# Patient Record
Sex: Female | Born: 1995 | Race: Black or African American | Hispanic: No | Marital: Single | Smoking: Never smoker
Health system: Southern US, Community
[De-identification: ages and names within clinical notes are randomized; demographics above are authoritative.]

## PROBLEM LIST (undated history)

## (undated) HISTORY — PX: NO PAST SURGERIES: SHX2092

---

## 2019-01-04 ENCOUNTER — Encounter: Payer: Self-pay | Admitting: Emergency Medicine

## 2019-01-04 ENCOUNTER — Other Ambulatory Visit: Payer: Self-pay

## 2019-01-04 ENCOUNTER — Ambulatory Visit
Admission: EM | Admit: 2019-01-04 | Discharge: 2019-01-04 | Disposition: A | Discharge status: Home / Self Care | Payer: 59 | Attending: Family Medicine | Admitting: Family Medicine

## 2019-01-04 DIAGNOSIS — J029 Acute pharyngitis, unspecified: Secondary | ICD-10-CM | POA: Diagnosis not present

## 2019-01-04 DIAGNOSIS — R509 Fever, unspecified: Secondary | ICD-10-CM | POA: Diagnosis not present

## 2019-01-04 DIAGNOSIS — A084 Viral intestinal infection, unspecified: Secondary | ICD-10-CM | POA: Diagnosis not present

## 2019-01-04 LAB — RAPID STREP SCREEN (MED CTR MEBANE ONLY): Streptococcus, Group A Screen (Direct): NEGATIVE

## 2019-01-04 NOTE — ED Provider Notes (Signed)
[facility]    CSN: [id number] Arrival date & time: 01/04/19  1128     History   Chief Complaint Chief Complaint  Patient presents with  . Headache  . Sore Throat  . Fever    HPI Jill Paul is a 23 y.o. female.   23 yo female with a c/o watery diarrhea last week which improved over the last 2 days. Denies any vomiting. States today woke up with sore throat and had a fever of 101 when she was screened at work. States mild pain with swallowing. Denies any cough or shortness of breath.      History reviewed. No pertinent past medical history.  There are no active problems to display for this patient.   History reviewed. No pertinent surgical history.  OB History   No obstetric history on file.      Home Medications    Prior to Admission medications   Medication Sig Start Date End Date Taking? Authorizing Provider  etonogestrel (NEXPLANON) 68 MG IMPL implant 1 each by Subdermal route once.   Yes [provider]    Family History Family History  Problem Relation Age of Onset  . Hypertension Mother   . Healthy Father     Social History Social History   Tobacco Use  . Smoking status: Former Games developermoker  . Smokeless tobacco: Never Used  Substance Use Topics  . Alcohol use: Yes  . Drug use: Yes    Types: Marijuana     Allergies   Patient has no known allergies.   Review of Systems Review of Systems   Physical Exam Triage Vital Signs ED Triage Vitals  Enc Vitals Group     BP 01/04/19 1150 92/63     Pulse Rate 01/04/19 1150 64     Resp 01/04/19 1150 14     Temp 01/04/19 1150 98.4 F (36.9 C)     Temp Source 01/04/19 1150 Oral     SpO2 01/04/19 1150 100 %     Weight 01/04/19 1146 130 lb (59 kg)     Height 01/04/19 1146 5\' 2"  (1.575 m)     Head Circumference --      Peak Flow --      Pain Score 01/04/19 1145 6     Pain Loc --      Pain Edu? --      Excl. in GC? --    No data found.  Updated Vital Signs BP 92/63 (BP  Location: Left Arm)   Pulse 64   Temp 98.4 F (36.9 C) (Oral)   Resp 14   Ht 5\' 2"  (1.575 m)   Wt 59 kg   SpO2 100%   BMI 23.78 kg/m   Visual Acuity Right Eye Distance:   Left Eye Distance:   Bilateral Distance:    Right Eye Near:   Left Eye Near:    Bilateral Near:     Physical Exam Vitals signs and nursing note reviewed.  Constitutional:      General: She is not in acute distress.    Appearance: She is not toxic-appearing or diaphoretic.  HENT:     Mouth/Throat:     Pharynx: Posterior oropharyngeal erythema present. No oropharyngeal exudate.  Cardiovascular:     Rate and Rhythm: Normal rate.     Pulses: Normal pulses.  Pulmonary:     Effort: Pulmonary effort is normal. No respiratory distress.  Abdominal:     General: Bowel sounds are normal. There is no  distension.     Palpations: Abdomen is soft.  Neurological:     Mental Status: She is alert.      UC Treatments / Results  Labs (all labs ordered are listed, but only abnormal results are displayed) Labs Reviewed  RAPID STREP SCREEN ([facility] ONLY)  NOVEL CORONAVIRUS, NAA (HOSPITAL ORDER, SEND-OUT TO REF LAB)  CULTURE, GROUP A STREP [facility])    EKG   Radiology No results found.  Procedures Procedures (including critical care time)  Medications Ordered in UC Medications - No data to display  Initial Impression / Assessment and Plan / UC Course  I have reviewed the triage vital signs and the nursing notes.  Pertinent labs & imaging results that were available during my care of the patient were reviewed by me and considered in my medical decision making (see chart for details).      Final Clinical Impressions(s) / UC Diagnoses   Final diagnoses:  Viral gastroenteritis  Sore throat  Fever, unspecified     Discharge Instructions     Self quarantine per [place] DHHS guidelines Await test result Rest, fluids, over the counter tylenol/advil as needed    ED Prescriptions    None       1. Lab results and diagnosis reviewed with patient 2. Recommend supportive treatment as above 3. covid test done; advice given 4. Follow-up prn  Controlled Substance Prescriptions [place] Controlled Substance Registry consulted? Not Applicable   Norval Gable, MD 01/04/19 [phone]

## 2019-01-04 NOTE — Discharge Instructions (Signed)
Self quarantine per [facility] DHHS guidelines Await test result Rest, fluids, over the counter tylenol/advil as needed

## 2019-01-04 NOTE — ED Triage Notes (Signed)
Patient c/o diarrhea for a week.  Patient c/o HAs off and on last week. Patient c/o sore throat and fever that started this morning.

## 2019-01-05 LAB — NOVEL CORONAVIRUS, NAA (HOSP ORDER, SEND-OUT TO REF LAB; TAT 18-24 HRS): SARS-CoV-2, NAA: NOT DETECTED

## 2019-01-07 LAB — CULTURE, GROUP A STREP (THRC)

## 2019-01-22 ENCOUNTER — Other Ambulatory Visit: Payer: Self-pay

## 2019-01-22 ENCOUNTER — Ambulatory Visit: Payer: 59

## 2019-01-22 ENCOUNTER — Ambulatory Visit (INDEPENDENT_AMBULATORY_CARE_PROVIDER_SITE_OTHER): Payer: 59

## 2019-01-22 ENCOUNTER — Ambulatory Visit
Admission: EM | Admit: 2019-01-22 | Discharge: 2019-01-22 | Disposition: A | Discharge status: Home / Self Care | Payer: 59 | Attending: Family Medicine | Admitting: Family Medicine

## 2019-01-22 DIAGNOSIS — M79672 Pain in left foot: Secondary | ICD-10-CM

## 2019-01-22 DIAGNOSIS — M25572 Pain in left ankle and joints of left foot: Secondary | ICD-10-CM

## 2019-01-22 DIAGNOSIS — S9002XA Contusion of left ankle, initial encounter: Secondary | ICD-10-CM

## 2019-01-22 DIAGNOSIS — W101XXA Fall (on)(from) sidewalk curb, initial encounter: Secondary | ICD-10-CM

## 2019-01-22 MED ORDER — NAPROXEN 500 MG PO TABS: 500.0000 mg | ORAL_TABLET | Freq: Two times a day (BID) | ORAL | 0 refills | Status: DC
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
## 2019-01-22 NOTE — Discharge Instructions (Addendum)
Apply ice 20 minutes out of every 2 hours 4-5 times daily for comfort.  Wear the boot for active times and may remove for quiet times.  Elevate above the level of your heart sufficiently to control swelling.

## 2019-02-09 ENCOUNTER — Ambulatory Visit: Admission: EM | Admit: 2019-02-09 | Discharge: 2019-02-09 | Discharge status: Against Medical Advice | Payer: 59

## 2019-02-09 ENCOUNTER — Inpatient Hospital Stay: Admission: RE | Admit: 2019-02-09 | Payer: 59 | Source: Ambulatory Visit

## 2020-01-30 ENCOUNTER — Ambulatory Visit (HOSPITAL_COMMUNITY)
Admission: EM | Admit: 2020-01-30 | Discharge: 2020-01-30 | Disposition: A | Discharge status: Home / Self Care | Payer: Managed Care, Other (non HMO) | Attending: Family Medicine | Admitting: Family Medicine

## 2020-01-30 ENCOUNTER — Other Ambulatory Visit: Payer: Self-pay

## 2020-01-30 ENCOUNTER — Encounter (HOSPITAL_COMMUNITY): Payer: Self-pay

## 2020-01-30 DIAGNOSIS — R21 Rash and other nonspecific skin eruption: Secondary | ICD-10-CM | POA: Diagnosis not present

## 2020-01-30 MED ORDER — VALACYCLOVIR HCL 1 G PO TABS: 1000.0000 mg | ORAL_TABLET | Freq: Three times a day (TID) | ORAL | 0 refills | Status: DC

## 2020-01-30 NOTE — Discharge Instructions (Addendum)
Valtrex 3 times a day for a week.  You  can do ibuprofen for pain every 8 hours as needed.  Up to 600 mg. Zyrtec for itching as needed

## 2020-01-30 NOTE — ED Triage Notes (Signed)
Pt presents with rash on left thigh and right hip with itching & burning  X 2 days.

## 2020-01-31 NOTE — ED Provider Notes (Signed)
[facility]    CSN: [id number] Arrival date & time: 01/30/20  1626      History   Chief Complaint Chief Complaint  Patient presents with  . Rash    HPI Jill Paul is a 24 y.o. female.   Pt is a 24 year old female that presents today with rash to left upper thigh area and right hip area.  Describes as mild itching but mostly burning and painful over the past 2 days.  Some mild chills.   Denies any fever, joint pain. Denies any recent changes in lotions, detergents, foods or other possible irritants. No recent travel. Nobody else at home has the rash. Patient has been outside but denies any contact with plants or insects. No new foods or medications.   ROS per HPI      History reviewed. No pertinent past medical history.  There are no problems to display for this patient.   Past Surgical History:  Procedure Laterality Date  . NO PAST SURGERIES      OB History   No obstetric history on file.      Home Medications    Prior to Admission medications   Medication Sig Start Date End Date Taking? Authorizing Provider  etonogestrel (NEXPLANON) 68 MG IMPL implant 1 each by Subdermal route once.    [provider]  naproxen (NAPROSYN) 500 MG tablet Take 1 tablet (500 mg total) by mouth 2 (two) times daily with a meal. 01/22/19   Lutricia Feil, PA-C  valACYclovir (VALTREX) 1000 MG tablet Take 1 tablet (1,000 mg total) by mouth 3 (three) times daily. 01/30/20   Janace Aris, NP    Family History Family History  Problem Relation Age of Onset  . Hypertension Mother   . Healthy Father     Social History Social History   Tobacco Use  . Smoking status: Former Games developer  . Smokeless tobacco: Never Used  Vaping Use  . Vaping Use: Never used  Substance Use Topics  . Alcohol use: Yes  . Drug use: Yes    Types: Marijuana     Allergies   Patient has no known allergies.   Review of Systems Review of Systems   Physical Exam Triage Vital  Signs ED Triage Vitals [01/30/20 1759]  Enc Vitals Group     BP 122/64     Pulse Rate 74     Resp 18     Temp 98.8 F (37.1 C)     Temp Source Oral     SpO2 100 %     Weight      Height      Head Circumference      Peak Flow      Pain Score 4     Pain Loc      Pain Edu?      Excl. in GC?    No data found.  Updated Vital Signs BP 122/64 (BP Location: Left Arm)   Pulse 74   Temp 98.8 F (37.1 C) (Oral)   Resp 18   SpO2 100%   Visual Acuity Right Eye Distance:   Left Eye Distance:   Bilateral Distance:    Right Eye Near:   Left Eye Near:    Bilateral Near:     Physical Exam Vitals and nursing note reviewed.  Constitutional:      General: She is not in acute distress.    Appearance: Normal appearance. She is not ill-appearing, toxic-appearing or diaphoretic.  HENT:  Head: Normocephalic.     Nose: Nose normal.  Eyes:     Conjunctiva/sclera: Conjunctivae normal.  Pulmonary:     Effort: Pulmonary effort is normal.  Musculoskeletal:        General: Normal range of motion.     Cervical back: Normal range of motion.  Skin:    General: Skin is warm and dry.     Findings: Rash present.     Comments: Erythematous papular rash in clusters L2 dermatone  Neurological:     Mental Status: She is alert.  Psychiatric:        Mood and Affect: Mood normal.      UC Treatments / Results  Labs (all labs ordered are listed, but only abnormal results are displayed) Labs Reviewed - No data to display  EKG   Radiology No results found.  Procedures Procedures (including critical care time)  Medications Ordered in UC Medications - No data to display  Initial Impression / Assessment and Plan / UC Course  I have reviewed the triage vital signs and the nursing notes.  Pertinent labs & imaging results that were available during my care of the patient were reviewed by me and considered in my medical decision making (see chart for details).     Rash Most  consistent with herpes zoster Treating with Valtrex Ibuprofen every 8 hours for pain as needed and Zyrtec for itching as needed. Follow up as needed for continued or worsening symptoms  Final Clinical Impressions(s) / UC Diagnoses   Final diagnoses:  Rash     Discharge Instructions     Valtrex 3 times a day for a week.  You  can do ibuprofen for pain every 8 hours as needed.  Up to 600 mg. Zyrtec for itching as needed    ED Prescriptions    Medication Sig Dispense Auth. Provider   valACYclovir (VALTREX) 1000 MG tablet  (Status: Discontinued) Take 1 tablet (1,000 mg total) by mouth 3 (three) times daily. 21 tablet Bianca Raneri A, NP   valACYclovir (VALTREX) 1000 MG tablet Take 1 tablet (1,000 mg total) by mouth 3 (three) times daily. 21 tablet Shakima Nisley A, NP     PDMP not reviewed this encounter.   Dahlia Byes A, NP 01/31/20 0830

## 2021-01-03 ENCOUNTER — Other Ambulatory Visit: Payer: Self-pay

## 2021-01-03 ENCOUNTER — Emergency Department
Admission: EM | Admit: 2021-01-03 | Discharge: 2021-01-03 | Disposition: A | Discharge status: Home / Self Care | Payer: Managed Care, Other (non HMO) | Attending: Emergency Medicine | Admitting: Emergency Medicine

## 2021-01-03 ENCOUNTER — Emergency Department: Payer: Managed Care, Other (non HMO)

## 2021-01-03 DIAGNOSIS — R109 Unspecified abdominal pain: Secondary | ICD-10-CM | POA: Diagnosis not present

## 2021-01-03 DIAGNOSIS — R112 Nausea with vomiting, unspecified: Secondary | ICD-10-CM | POA: Diagnosis present

## 2021-01-03 DIAGNOSIS — A084 Viral intestinal infection, unspecified: Secondary | ICD-10-CM | POA: Insufficient documentation

## 2021-01-03 DIAGNOSIS — Z87891 Personal history of nicotine dependence: Secondary | ICD-10-CM | POA: Insufficient documentation

## 2021-01-03 LAB — BASIC METABOLIC PANEL WITH GFR
Anion gap: 7 (ref 5–15)
BUN: 9 mg/dL (ref 6–20)
CO2: 25 mmol/L (ref 22–32)
Calcium: 9.6 mg/dL (ref 8.9–10.3)
Chloride: 105 mmol/L (ref 98–111)
Creatinine, Ser: 0.78 mg/dL (ref 0.44–1.00)
GFR, Estimated: >60 mL/min
Glucose, Bld: 96 mg/dL (ref 70–99)
Potassium: 3.7 mmol/L (ref 3.5–5.1)
Sodium: 137 mmol/L (ref 135–145)

## 2021-01-03 LAB — CBC
HCT: 41.3 % (ref 36.0–46.0)
Hemoglobin: 14.1 g/dL (ref 12.0–15.0)
MCH: 31.7 pg (ref 26.0–34.0)
MCHC: 34.1 g/dL (ref 30.0–36.0)
MCV: 92.8 fL (ref 80.0–100.0)
Platelets: 266 10*3/uL (ref 150–400)
RBC: 4.45 MIL/uL (ref 3.87–5.11)
RDW: 12 % (ref 11.5–15.5)
WBC: 5.3 10*3/uL (ref 4.0–10.5)
nRBC: 0 % (ref 0.0–0.2)

## 2021-01-03 LAB — HEPATIC FUNCTION PANEL
ALT: 19 U/L (ref 0–44)
AST: 21 U/L (ref 15–41)
Albumin: 4.7 g/dL (ref 3.5–5.0)
Alkaline Phosphatase: 60 U/L (ref 38–126)
Bilirubin, Direct: <0.1 mg/dL (ref 0.0–0.2)
Total Bilirubin: 0.5 mg/dL (ref 0.3–1.2)
Total Protein: 7.8 g/dL (ref 6.5–8.1)

## 2021-01-03 LAB — URINALYSIS, COMPLETE (UACMP) WITH MICROSCOPIC
Bacteria, UA: NONE SEEN
Bilirubin Urine: NEGATIVE
Glucose, UA: NEGATIVE mg/dL
Ketones, ur: NEGATIVE mg/dL
Leukocytes,Ua: NEGATIVE
Nitrite: NEGATIVE
Protein, ur: NEGATIVE mg/dL
Specific Gravity, Urine: 1.006 (ref 1.005–1.030)
pH: 7 (ref 5.0–8.0)

## 2021-01-03 LAB — POC URINE PREG, ED: Preg Test, Ur: NEGATIVE

## 2021-01-03 LAB — LIPASE, BLOOD: Lipase: 39 U/L (ref 11–51)

## 2021-01-03 MED ORDER — ONDANSETRON 4 MG PO TBDP
4.0000 mg | ORAL_TABLET | Freq: Once | ORAL | Status: AC
  Administered 2021-01-03: 4 mg via ORAL
  Filled 2021-01-03: qty 1

## 2021-01-03 MED ORDER — ONDANSETRON 4 MG PO TBDP: 4.0000 mg | ORAL_TABLET | Freq: Three times a day (TID) | ORAL | 0 refills | Status: DC | PRN

## 2021-01-03 MED ORDER — LOPERAMIDE HCL 2 MG PO TABS: 2.0000 mg | ORAL_TABLET | Freq: Four times a day (QID) | ORAL | 0 refills | Status: DC | PRN

## 2021-01-03 NOTE — ED Provider Notes (Signed)
[facility] Emergency Department Provider Note  ____________________________________________  Time seen: Approximately 6:21 PM  I have reviewed the triage vital signs and the nursing notes.   HISTORY  Chief Complaint Flank Pain    HPI Jill Paul is a 25 y.o. female who presents emergency department complaining of left flank and left abdominal pain.  Patient had some nausea starting 3 days ago, then developed flank and abdominal pain the day after.  She has had some nausea and vomiting as well as some loose stools.  No hematic emesis.  No bloody stools.  No history of diverticulitis.  Patient with no bilious vomit.  No reported fevers or chills.  Patient with no history of nephrolithiasis.  No dysuria or polyuria.  Patient was seen in urgent care and had hematuria and was referred to the emergency department for further evaluation.       History reviewed. No pertinent past medical history.  There are no problems to display for this patient.   Past Surgical History:  Procedure Laterality Date   NO PAST SURGERIES      Prior to Admission medications   Medication Sig Start Date End Date Taking? Authorizing Provider  etonogestrel (NEXPLANON) 68 MG IMPL implant 1 each by Subdermal route once.    [provider]  loperamide (IMODIUM A-D) 2 MG tablet Take 1 tablet (2 mg total) by mouth 4 (four) times daily as needed for diarrhea or loose stools. 01/03/21  Yes Alano Blasco, Delorise Royals, PA-C  naproxen (NAPROSYN) 500 MG tablet Take 1 tablet (500 mg total) by mouth 2 (two) times daily with a meal. 01/22/19   Lutricia Feil, PA-C  ondansetron (ZOFRAN-ODT) 4 MG disintegrating tablet Take 1 tablet (4 mg total) by mouth every 8 (eight) hours as needed for nausea or vomiting. 01/03/21  Yes Jayln Branscom, Delorise Royals, PA-C  valACYclovir (VALTREX) 1000 MG tablet Take 1 tablet (1,000 mg total) by mouth 3 (three) times daily. 01/30/20   Janace Aris, NP     Allergies Patient has no known allergies.  Family History  Problem Relation Age of Onset   Hypertension Mother    Healthy Father     Social History Social History   Tobacco Use   Smoking status: Former   Smokeless tobacco: Never  Building services engineer Use: Never used  Substance Use Topics   Alcohol use: Yes   Drug use: Yes    Types: Marijuana     Review of Systems  Constitutional: No fever/chills Eyes: No visual changes. No discharge ENT: No upper respiratory complaints. Cardiovascular: no chest pain. Respiratory: no cough. No SOB. Gastrointestinal: Left sided abdominal pain and left flank pain.  No nausea, no vomiting.  No diarrhea.  No constipation. Genitourinary: Negative for dysuria.  Positive for hematuria Musculoskeletal: Negative for musculoskeletal pain. Skin: Negative for rash, abrasions, lacerations, ecchymosis. Neurological: Negative for headaches, focal weakness or numbness.  10 System ROS otherwise negative.  ____________________________________________   PHYSICAL EXAM:  VITAL SIGNS: ED Triage Vitals  Enc Vitals Group     BP 01/03/21 1510 (!) 148/107     Pulse Rate 01/03/21 1510 91     Resp 01/03/21 1510 18     Temp 01/03/21 1510 98.9 F (37.2 C)     Temp Source 01/03/21 1510 Oral     SpO2 01/03/21 1510 100 %     Weight 01/03/21 1511 145 lb (65.8 kg)     Height 01/03/21 1511 5\' 2"  (1.575 m)  Head Circumference --      Peak Flow --      Pain Score 01/03/21 1510 6     Pain Loc --      Pain Edu? --      Excl. in GC? --      Constitutional: Alert and oriented. Well appearing and in no acute distress. Eyes: Conjunctivae are normal. PERRL. EOMI. Head: Atraumatic. ENT:      Ears:       Nose: No congestion/rhinnorhea.      Mouth/Throat: Mucous membranes are moist.  Neck: No stridor.    Cardiovascular: Normal rate, regular rhythm. Normal S1 and S2.  Good peripheral circulation. Respiratory: Normal respiratory effort without tachypnea  or retractions. Lungs CTAB. Good air entry to the bases with no decreased or absent breath sounds. Gastrointestinal: No visible external abdominal wall findings.  Bowel sounds 4 quadrants.  Soft to palpation all quadrants, tender on the left upper and lower with primary tenderness in the left upper quadrant.  No palpable abnormalities.  No guarding or rigidity. No palpable masses. No distention.  Left-sided CVA tenderness. Musculoskeletal: Full range of motion to all extremities. No gross deformities appreciated. Neurologic:  Normal speech and language. No gross focal neurologic deficits are appreciated.  Skin:  Skin is warm, dry and intact. No rash noted. Psychiatric: Mood and affect are normal. Speech and behavior are normal. Patient exhibits appropriate insight and judgement.   ____________________________________________   LABS (all labs ordered are listed, but only abnormal results are displayed)  Labs Reviewed  URINALYSIS, COMPLETE (UACMP) WITH MICROSCOPIC - Abnormal; Notable for the following components:      Result Value   Color, Urine STRAW (*)    APPearance CLEAR (*)    Hgb urine dipstick SMALL (*)    All other components within normal limits  BASIC METABOLIC PANEL  CBC  HEPATIC FUNCTION PANEL  LIPASE, BLOOD  POC URINE PREG, ED   ____________________________________________  EKG   ____________________________________________  RADIOLOGY I personally viewed and evaluated these images as part of my medical decision making, as well as reviewing the written report by the radiologist.  ED Provider Interpretation: No acute findings on CT scan specifically no nephrolithiasis, hydronephrosis, ovarian cyst or findings consistent with colitis  CT Renal Stone Study  Result Date: 01/03/2021 CLINICAL DATA:  Acute left flank pain. EXAM: CT ABDOMEN AND PELVIS WITHOUT CONTRAST TECHNIQUE: Multidetector CT imaging of the abdomen and pelvis was performed following the standard protocol  without IV contrast. COMPARISON:  None. FINDINGS: Lower chest: No acute abnormality. Hepatobiliary: No focal liver abnormality is seen. No gallstones, gallbladder wall thickening, or biliary dilatation. Pancreas: Unremarkable. No pancreatic ductal dilatation or surrounding inflammatory changes. Spleen: Normal in size without focal abnormality. Adrenals/Urinary Tract: Adrenal glands are unremarkable. Kidneys are normal, without renal calculi, focal lesion, or hydronephrosis. Bladder is unremarkable. Stomach/Bowel: Stomach is within normal limits. Appendix appears normal. No evidence of bowel wall thickening, distention, or inflammatory changes. Vascular/Lymphatic: No significant vascular findings are present. No enlarged abdominal or pelvic lymph nodes. Reproductive: Uterus and bilateral adnexa are unremarkable. Other: No abdominal wall hernia or abnormality. No abdominopelvic ascites. Musculoskeletal: No acute or significant osseous findings. IMPRESSION: No definite abnormality seen in the abdomen or pelvis. Electronically Signed   By: Lupita Raider M.D.   On: 01/03/2021 18:55    ____________________________________________    PROCEDURES  Procedure(s) performed:    Procedures    Medications  ondansetron (ZOFRAN-ODT) disintegrating tablet 4 mg (4 mg Oral Given  01/03/21 1518)     ____________________________________________   INITIAL IMPRESSION / ASSESSMENT AND PLAN / ED COURSE  Pertinent labs & imaging results that were available during my care of the patient were reviewed by me and considered in my medical decision making (see chart for details).  Review of the [place] CSRS was performed in accordance of the [facility] prior to dispensing any controlled drugs.           Patient's diagnosis is consistent with viral gastroenteritis.  Patient presented to the emergency room with nausea, vomiting, diarrhea, left flank and side pain.  Patient was tender diffusely in the left side with slight  increased tenderness in the left upper quadrant as well as left-sided CVA tenderness.  Patient has been evaluated in urgent care and had some hematuria and was referred to the ED for evaluation.  CT scan revealed no evidence of stone, no evidence consistent with UTI or pyelonephritis on imaging or labs.  Labs are otherwise reassuring.  Imaging was otherwise reassuring.  Given the onset of symptoms with no history of GI complaints I suspect a component of viral gastroenteritis.  Symptom control medications will be prescribed to the patient to include Zofran and loperamide.  Follow-up primary care as needed. Patient is given ED precautions to return to the ED for any worsening or new symptoms.     ____________________________________________  FINAL CLINICAL IMPRESSION(S) / ED DIAGNOSES  Final diagnoses:  Viral gastroenteritis      NEW MEDICATIONS STARTED DURING THIS VISIT:  ED Discharge Orders          Ordered    loperamide (IMODIUM A-D) 2 MG tablet  4 times daily PRN        01/03/21 1933    ondansetron (ZOFRAN-ODT) 4 MG disintegrating tablet  Every 8 hours PRN        01/03/21 1933                This chart was dictated using voice recognition software/Dragon. Despite [employer] efforts to proofread, errors can occur which can change the meaning. Any change was purely unintentional.    Racheal Patches, PA-C 01/03/21 1942    Delton Prairie, MD 01/03/21 2015

## 2021-01-03 NOTE — ED Notes (Signed)
Lab called at this time to add on blood work. 

## 2021-01-03 NOTE — ED Triage Notes (Signed)
Pt to ED for left sided flank pain and lower abd pain since Saturday with emesis.  Pt in NAD

## 2021-04-29 IMAGING — CR LEFT ANKLE COMPLETE - 3+ VIEW
3 series · 4 of 4 positions shown · non-contrast
Comparison: None.

CLINICAL DATA: Acute right ankle pain and swelling after injury.

EXAM:
LEFT ANKLE COMPLETE - 3+ VIEW

[ankle ap]
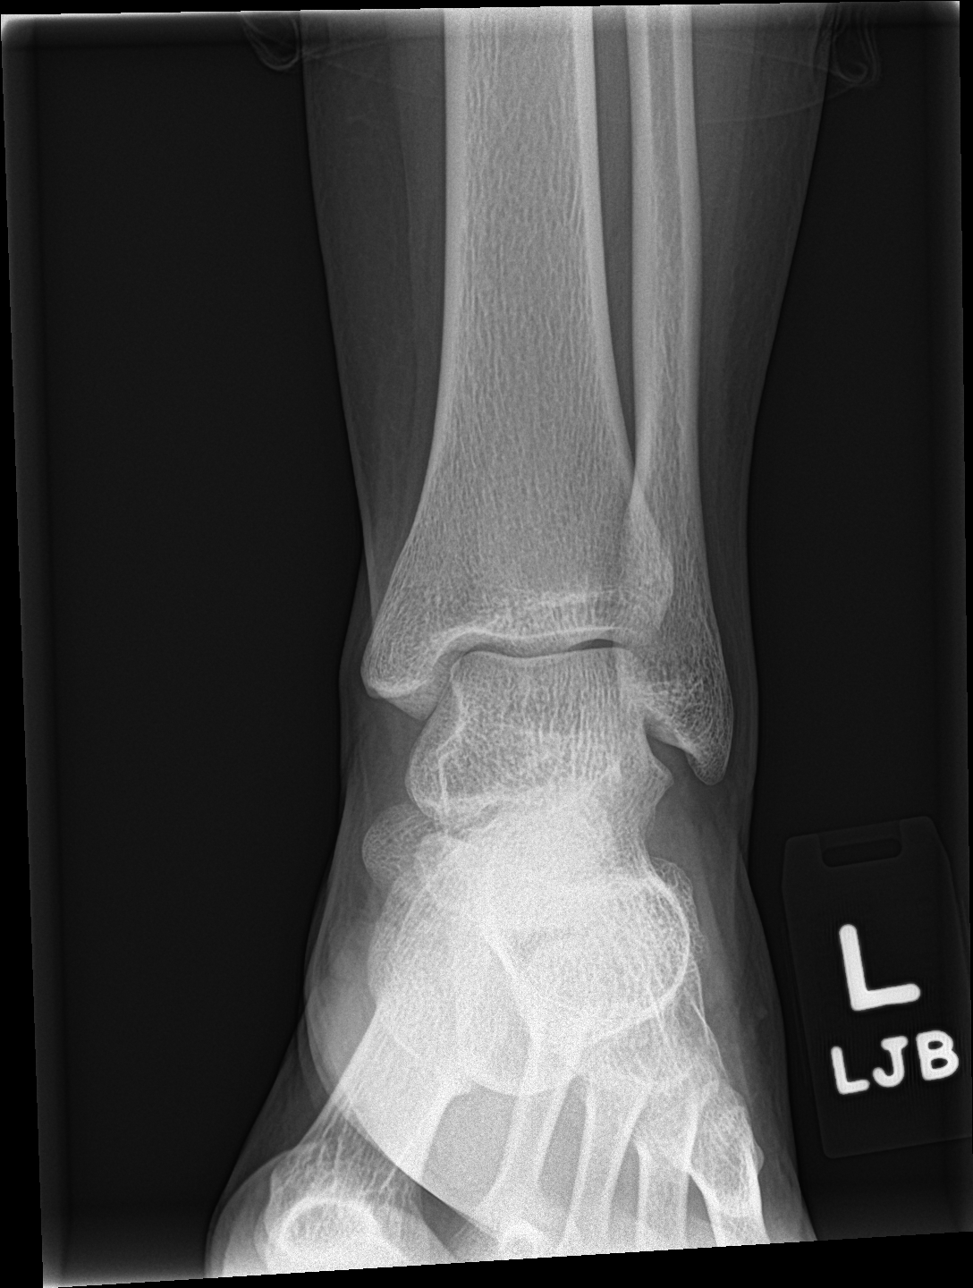

[ankle obl]
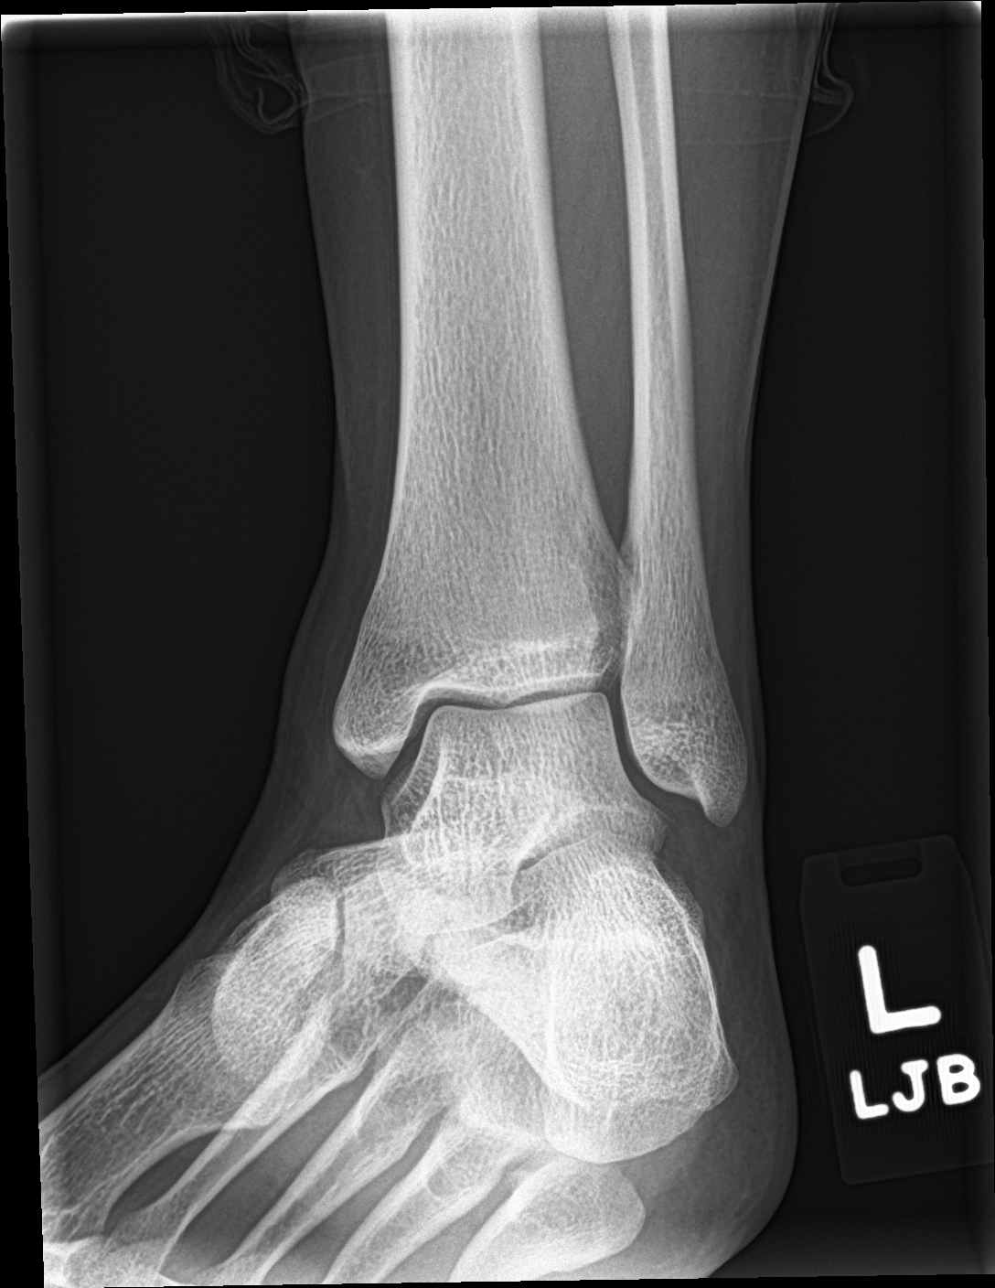

[Series 3: ankle lat · 0.14mm/px · 2 of 2 slices shown]
[im 1/2]
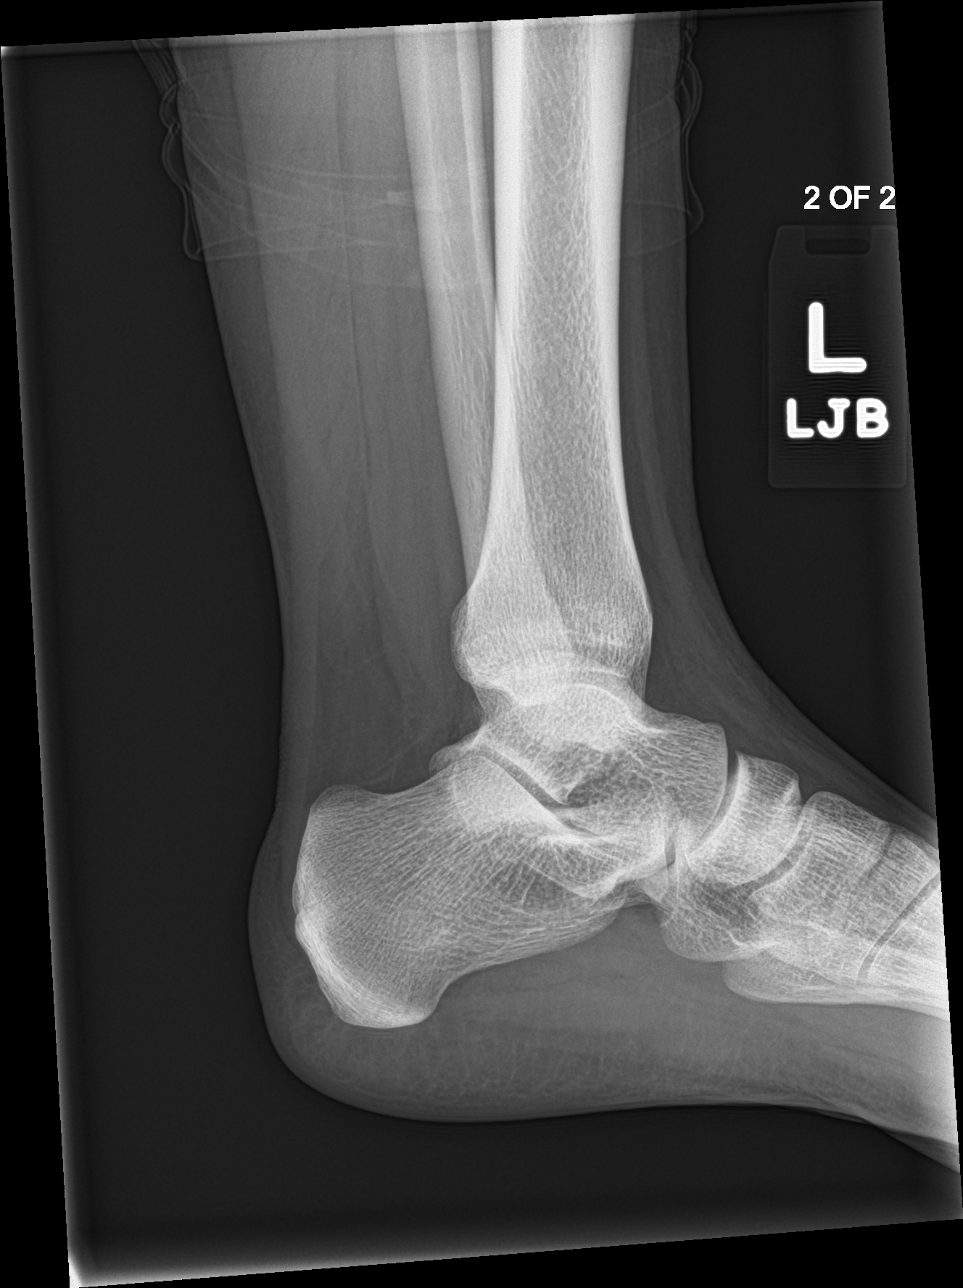
[im 2/2]
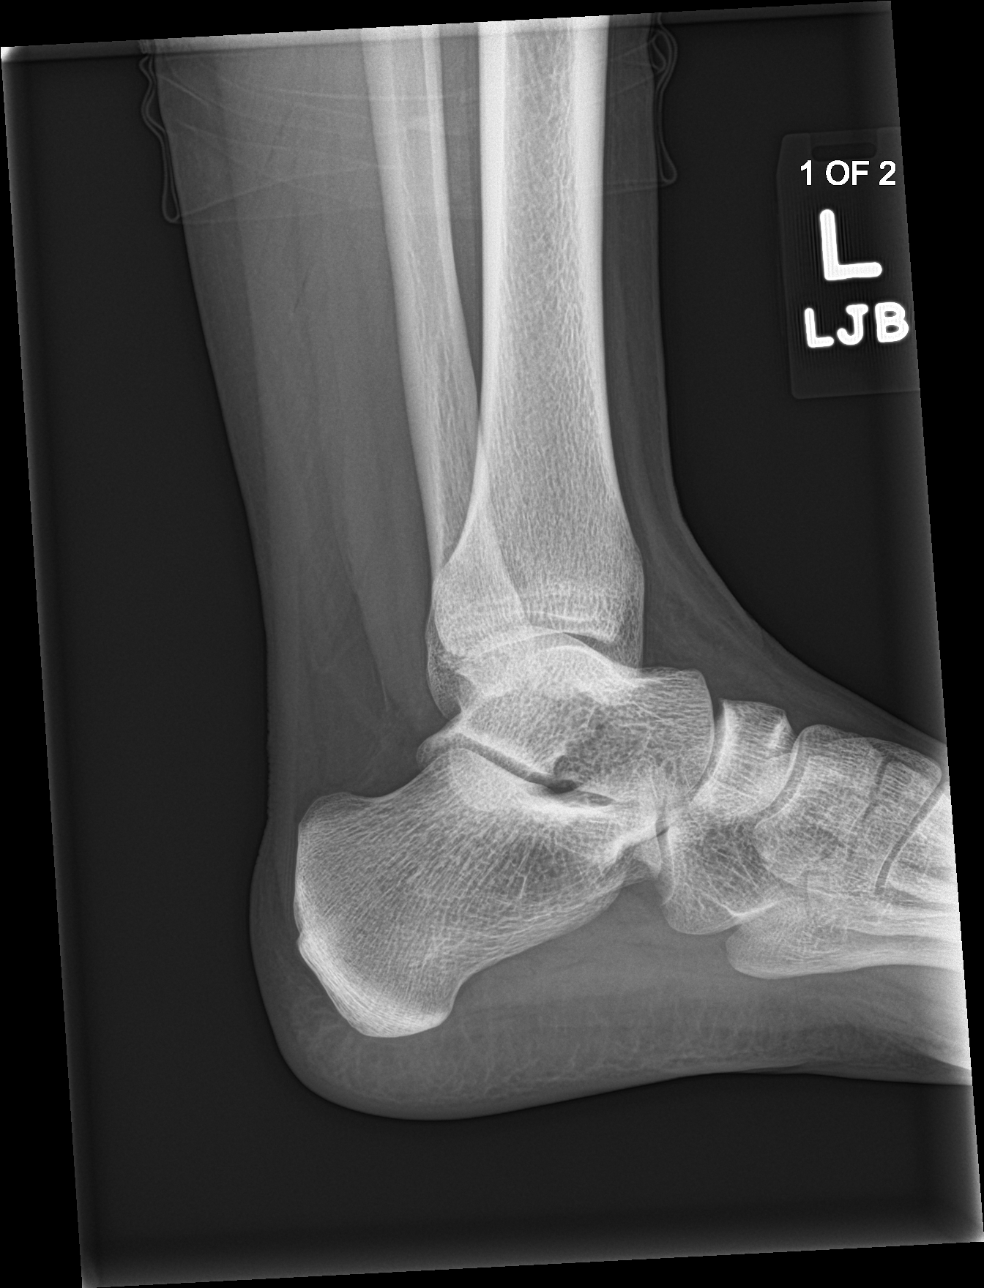

[4 of 4 positions shown; findings below may reference images not displayed]

FINDINGS: There is no evidence of fracture, dislocation, or joint effusion.
There is no evidence of arthropathy or other focal bone abnormality.
Soft tissues are unremarkable.
IMPRESSION: Negative.

## 2021-08-28 ENCOUNTER — Other Ambulatory Visit: Payer: Self-pay

## 2021-08-28 ENCOUNTER — Emergency Department: Payer: Managed Care, Other (non HMO)

## 2021-08-28 ENCOUNTER — Emergency Department
Admission: EM | Admit: 2021-08-28 | Discharge: 2021-08-28 | Disposition: A | Discharge status: Home / Self Care | Payer: Managed Care, Other (non HMO) | Attending: Emergency Medicine | Admitting: Emergency Medicine

## 2021-08-28 DIAGNOSIS — Y92410 Unspecified street and highway as the place of occurrence of the external cause: Secondary | ICD-10-CM | POA: Insufficient documentation

## 2021-08-28 DIAGNOSIS — S161XXA Strain of muscle, fascia and tendon at neck level, initial encounter: Secondary | ICD-10-CM | POA: Diagnosis not present

## 2021-08-28 DIAGNOSIS — S29012A Strain of muscle and tendon of back wall of thorax, initial encounter: Secondary | ICD-10-CM | POA: Insufficient documentation

## 2021-08-28 DIAGNOSIS — S39012A Strain of muscle, fascia and tendon of lower back, initial encounter: Secondary | ICD-10-CM | POA: Diagnosis not present

## 2021-08-28 DIAGNOSIS — S199XXA Unspecified injury of neck, initial encounter: Secondary | ICD-10-CM | POA: Diagnosis present

## 2021-08-28 DIAGNOSIS — S29019A Strain of muscle and tendon of unspecified wall of thorax, initial encounter: Secondary | ICD-10-CM

## 2021-08-28 LAB — POC URINE PREG, ED: Preg Test, Ur: NEGATIVE

## 2021-08-28 MED ORDER — ETODOLAC 400 MG PO TABS: 400.0000 mg | ORAL_TABLET | Freq: Two times a day (BID) | ORAL | 0 refills | Status: DC

## 2021-08-28 MED ORDER — METHOCARBAMOL 500 MG PO TABS: 500.0000 mg | ORAL_TABLET | Freq: Four times a day (QID) | ORAL | 0 refills | Status: DC | PRN
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
## 2021-08-28 NOTE — Discharge Instructions (Signed)
Follow-up with your primary care provider, urgent care or return to the emergency department if any continued problems or worsening of your symptoms.  You can expect to be sore for approximately 4 to 5 days which is not unusual.  You may use ice or heat to your back or neck as needed for discomfort.  There were 2 medications that were sent to the pharmacy.  The etodolac is twice a day with food which will help with pain and inflammation.  The methocarbamol is the muscle relaxant and should be taken as needed for muscle spasms.  Do not drive or operate machinery while taking the methocarbamol as it can cause drowsiness. ?

## 2021-10-25 ENCOUNTER — Encounter: Payer: 59 | Admitting: Advanced Practice Midwife

## 2021-12-20 ENCOUNTER — Encounter: Payer: 59 | Admitting: Advanced Practice Midwife

## 2022-08-06 ENCOUNTER — Ambulatory Visit: Payer: Managed Care, Other (non HMO) | Admitting: Family Medicine

## 2022-08-06 ENCOUNTER — Ambulatory Visit (LOCAL_COMMUNITY_HEALTH_CENTER): Payer: Managed Care, Other (non HMO) | Admitting: Family Medicine

## 2022-08-06 ENCOUNTER — Encounter: Payer: Self-pay | Admitting: Family Medicine

## 2022-08-06 VITALS — BP 112/75 | HR 66 | Ht 62.0 in | Wt 134.0 lb

## 2022-08-06 DIAGNOSIS — Z01419 Encounter for gynecological examination (general) (routine) without abnormal findings: Secondary | ICD-10-CM

## 2022-08-06 DIAGNOSIS — Z3046 Encounter for surveillance of implantable subdermal contraceptive: Secondary | ICD-10-CM

## 2022-08-06 DIAGNOSIS — N6311 Unspecified lump in the right breast, upper outer quadrant: Secondary | ICD-10-CM

## 2022-08-06 DIAGNOSIS — Z3009 Encounter for other general counseling and advice on contraception: Secondary | ICD-10-CM

## 2022-08-06 LAB — WET PREP FOR TRICH, YEAST, CLUE
Trichomonas Exam: NEGATIVE
Yeast Exam: NEGATIVE

## 2022-08-06 LAB — HM HIV SCREENING LAB: HM HIV Screening: NEGATIVE

## 2022-08-06 NOTE — Progress Notes (Signed)
Pt is here for PE, Pap smear, CBE, STD screening and Nexplanon removal.  Wet mount results reviewed, no treatment required per SO.  Nexplanon removed by Provider.  Pt declines additional BC, at this time.  FP packet given.  Windle Guard, RN

## 2022-08-06 NOTE — Progress Notes (Signed)
[facility] Number: [phone]  Family Planning Visit- Initial Visit  Subjective:  Jill Paul is a 27 y.o.  [id number]   being seen today for an initial annual visit and to discuss reproductive life planning.  The patient is currently using Hormonal Implant for pregnancy prevention. Patient reports   does not know want a pregnancy in the next year.     report they are looking for a method that provides Ready when they are   Patient has the following medical conditions does not have a problem list on file.  Chief Complaint  Patient presents with   Contraception    Physical and nexplanon removal     Patient reports to clinic for PE and removal of nexplanon.    Body mass index is 24.51 kg/m. - Patient is eligible for diabetes screening based on BMI> 25 and age >35?  no HA1C ordered? not applicable  Patient reports 1  partner/s in last year. Desires STI screening?  No - declined  Has patient been screened once for HCV in the past?  No  No results found for: "HCVAB"  Does the patient have current drug use (including MJ), have a partner with drug use, and/or has been incarcerated since last result? No  If yes-- Screen for HCV through [facility]   Does the patient meet criteria for HBV testing? No  Criteria:  -Household, sexual or needle sharing contact with HBV -History of drug use -HIV positive -Those with known Hep C   Health Maintenance Due  Topic Date Due   COVID-19 Vaccine (1) Never done   HPV VACCINES (1 - 2-dose series) Never done   HIV Screening  Never done   Hepatitis C Screening  Never done   DTaP/Tdap/Td (1 - Tdap) Never done   PAP-Cervical Cytology Screening  Never done   PAP SMEAR-Modifier  Never done   INFLUENZA VACCINE  Never done    Review of Systems  Constitutional:  Negative for weight loss.  Eyes:  Negative for blurred vision.  Respiratory:  Negative for cough and  shortness of breath.   Cardiovascular:  Negative for claudication.  Gastrointestinal:  Negative for nausea.  Genitourinary:  Negative for dysuria and frequency.  Skin:  Negative for rash.  Neurological:  Negative for headaches.  Endo/Heme/Allergies:  Does not bruise/bleed easily.    The following portions of the patient's history were reviewed and updated as appropriate: allergies, current medications, past family history, past medical history, past social history, past surgical history and problem list. Problem list updated.   See flowsheet for other program required questions.  Objective:   Vitals:   08/06/22 1600  BP: 112/75  Pulse: 66  Weight: 134 lb (60.8 kg)  Height: '5\' 2"'$  (1.575 m)    Physical Exam Vitals and nursing note reviewed.  Constitutional:      Appearance: Normal appearance.  HENT:     Head: Normocephalic and atraumatic.     Mouth/Throat:     Mouth: Mucous membranes are moist.     Pharynx: Oropharynx is clear. No oropharyngeal exudate or posterior oropharyngeal erythema.  Pulmonary:     Effort: Pulmonary effort is normal.  Chest:  Breasts:    Right: Mass present.     Left: Normal. No mass, nipple discharge or skin change.       Comments: Right breast nodule as indicated by pictogram at 12 o'clock, abotu 1-2 mm, hard, painful Abdominal:  General: Abdomen is flat.     Palpations: There is no mass.     Tenderness: There is no abdominal tenderness. There is no rebound.  Genitourinary:    General: Normal vulva.     Exam position: Lithotomy position.     Pubic Area: No rash or pubic lice.      Labia:        Right: No rash or lesion.        Left: No rash or lesion.      Vagina: Vaginal discharge present. No erythema, bleeding or lesions.     Cervix: No cervical motion tenderness, discharge, friability, lesion or erythema.     Uterus: Normal.      Adnexa: Right adnexa normal and left adnexa normal.     Rectum: Normal.     Comments: pH = 4  Mild  amt of white discharge present Lymphadenopathy:     Head:     Right side of head: No preauricular or posterior auricular adenopathy.     Left side of head: No preauricular or posterior auricular adenopathy.     Cervical: No cervical adenopathy.     Upper Body:     Right upper body: No supraclavicular, axillary or epitrochlear adenopathy.     Left upper body: No supraclavicular, axillary or epitrochlear adenopathy.     Lower Body: No right inguinal adenopathy. No left inguinal adenopathy.  Skin:    General: Skin is warm and dry.     Findings: No rash.  Neurological:     Mental Status: She is alert and oriented to person, place, and time.    Assessment and Plan:  Jill Paul is a 27 y.o. female presenting to the [facility] Department for an initial annual wellness/contraceptive visit  Contraception counseling: Reviewed options based on patient desire and reproductive life plan. Patient is interested in No Method - Other Reason. This was provided to the patient today.   Risks, benefits, and typical effectiveness rates were reviewed.  Questions were answered.  Written information was also given to the patient to review.    The patient will follow up in  1 years for surveillance.  The patient was told to call with any further questions, or with any concerns about this method of contraception.  Emphasized use of condoms 100% of the time for STI prevention.  Need for ECP was assessed. Not indicated- covered under Nexplanon.   1. Well woman exam with routine gynecological exam -multiple complaints including weight loss, headache, nausea, vaginal bleeding, and breast mass -pt states they had gained some weight but worked to lose it -states mom and grandma both had ovarian cysts- and she had a 1 week period of vaginal bleeding that happened after her period- she wonders if this is due to ovarian cysts- I explained this was not something I could evaluate- since I do not have an  ultrasound -referral faxed to [facility]   - IGP, rfx Aptima HPV ASCU - WET PREP FOR [place], YEAST, CLUE - Syphilis Serology, [facility] - HIV [place] [facility] - Chlamydia/Gonorrhea [place] [facility]  2. Encounter for Nexplanon removal Nexplanon Removal Patient identified, informed consent performed, consent signed.   Appropriate time out taken. Nexplanon site identified.  Area prepped in usual sterile fashon. 3 ml of 1% lidocaine with Epinephrine was used to anesthetize the area at the distal end of the implant and along implant site. A small stab incision was made right beside the implant on  the distal portion.  The Nexplanon rod was grasped using curved hemostats/manual and removed without difficulty.  There was minimal blood loss. There were no complications.  Steri-strips were applied over the small incision.  A pressure bandage was applied to reduce any bruising.  The patient tolerated the procedure well and was given post procedure instructions.   Nexplanon:   Counseled patient to take OTC analgesic starting as soon as lidocaine starts to wear off and take regularly for at least 48 hr to decrease discomfort.  Specifically to take with food or milk to decrease stomach upset and for IB 600 mg (3 tablets) every 6 hrs; IB 800 mg (4 tablets) every 8 hrs; or Aleve 2 tablets every 12 hrs.      3. Mass of upper outer quadrant of right breast -pt noticed right breast mass about 3 weeks ago- states it is painful -hard 1-58m nodule palpated on exam, located at 12 o'clock about 0.25cm from areola [facility]  Return if symptoms worsen or fail to improve.  Future Appointments  Date Time Provider [place] 08/22/2022  1:50 PM AC-FP PROVIDER AC-FAM None    HSharlet Salina FNP

## 2022-08-10 LAB — IGP, RFX APTIMA HPV ASCU: PAP Smear Comment: 0

## 2022-08-12 ENCOUNTER — Telehealth: Payer: Self-pay

## 2022-08-12 NOTE — Telephone Encounter (Signed)
Confirmed pt's referral at [facility] has been received, but is pending scheduling. Clerical agreed to call and get her scheduled as soon as possible.

## 2022-08-22 ENCOUNTER — Ambulatory Visit: Payer: Managed Care, Other (non HMO)

## 2022-08-27 ENCOUNTER — Other Ambulatory Visit: Payer: Self-pay | Admitting: Obstetrics and Gynecology

## 2022-08-27 DIAGNOSIS — N6315 Unspecified lump in the right breast, overlapping quadrants: Secondary | ICD-10-CM

## 2022-09-03 ENCOUNTER — Ambulatory Visit
Admission: RE | Admit: 2022-09-03 | Discharge: 2022-09-03 | Disposition: A | Discharge status: Home / Self Care | Payer: Managed Care, Other (non HMO) | Source: Ambulatory Visit | Attending: Obstetrics and Gynecology | Admitting: Obstetrics and Gynecology

## 2022-09-03 DIAGNOSIS — N6315 Unspecified lump in the right breast, overlapping quadrants: Secondary | ICD-10-CM | POA: Diagnosis present

## 2022-09-13 ENCOUNTER — Telehealth: Payer: Self-pay

## 2022-09-13 NOTE — Telephone Encounter (Signed)
RN called [facility] to follow up on status of referral. Confirmed that pt was seen on 08/26/22 for her referral. Referral closed.

## 2023-04-26 ENCOUNTER — Emergency Department: Payer: Managed Care, Other (non HMO)

## 2023-04-26 ENCOUNTER — Emergency Department
Admission: EM | Admit: 2023-04-26 | Discharge: 2023-04-26 | Disposition: A | Discharge status: Home / Self Care | Payer: Managed Care, Other (non HMO) | Attending: Emergency Medicine | Admitting: Emergency Medicine

## 2023-04-26 DIAGNOSIS — R519 Headache, unspecified: Secondary | ICD-10-CM | POA: Diagnosis not present

## 2023-04-26 DIAGNOSIS — S39012A Strain of muscle, fascia and tendon of lower back, initial encounter: Secondary | ICD-10-CM | POA: Diagnosis not present

## 2023-04-26 DIAGNOSIS — S3992XA Unspecified injury of lower back, initial encounter: Secondary | ICD-10-CM | POA: Diagnosis present

## 2023-04-26 DIAGNOSIS — Y92410 Unspecified street and highway as the place of occurrence of the external cause: Secondary | ICD-10-CM | POA: Insufficient documentation

## 2023-04-26 DIAGNOSIS — S134XXA Sprain of ligaments of cervical spine, initial encounter: Secondary | ICD-10-CM | POA: Insufficient documentation

## 2023-04-26 MED ORDER — CYCLOBENZAPRINE HCL 10 MG PO TABS
5.0000 mg | ORAL_TABLET | Freq: Once | ORAL | Status: AC
  Administered 2023-04-26: 5 mg via ORAL
  Filled 2023-04-26: qty 1

## 2023-04-26 MED ORDER — IBUPROFEN 800 MG PO TABS
800.0000 mg | ORAL_TABLET | Freq: Once | ORAL | Status: AC
  Administered 2023-04-26: 800 mg via ORAL
  Filled 2023-04-26: qty 1

## 2023-04-26 MED ORDER — CYCLOBENZAPRINE HCL 5 MG PO TABS: 5.0000 mg | ORAL_TABLET | Freq: Three times a day (TID) | ORAL | 0 refills | Status: AC | PRN

## 2023-04-26 MED ORDER — IBUPROFEN 800 MG PO TABS: 800.0000 mg | ORAL_TABLET | Freq: Three times a day (TID) | ORAL | 0 refills | Status: DC | PRN

## 2023-04-26 MED ORDER — CYCLOBENZAPRINE HCL 5 MG PO TABS: 5.0000 mg | ORAL_TABLET | Freq: Three times a day (TID) | ORAL | 0 refills | Status: DC | PRN
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
## 2023-06-25 ENCOUNTER — Ambulatory Visit
Admission: EM | Admit: 2023-06-25 | Discharge: 2023-06-25 | Disposition: A | Discharge status: Home / Self Care | Payer: Managed Care, Other (non HMO) | Attending: Emergency Medicine | Admitting: Emergency Medicine

## 2023-06-25 DIAGNOSIS — Z3202 Encounter for pregnancy test, result negative: Secondary | ICD-10-CM

## 2023-06-25 DIAGNOSIS — J069 Acute upper respiratory infection, unspecified: Secondary | ICD-10-CM | POA: Diagnosis not present

## 2023-06-25 LAB — POCT URINE PREGNANCY: Preg Test, Ur: NEGATIVE

## 2023-06-25 MED ORDER — AZITHROMYCIN 250 MG PO TABS: 250.0000 mg | ORAL_TABLET | Freq: Every day | ORAL | 0 refills | Status: DC

## 2023-06-25 NOTE — ED Provider Notes (Signed)
Jill Paul    CSN: [id number] Arrival date & time: 06/25/23  9562      History   Chief Complaint Chief Complaint  Patient presents with   Cough    HPI Jill Paul is a 28 y.o. female.  Patient presents with 5-day history of sore throat, congestion, cough, shortness of breath, back pain.  She reports fever 4 days ago but none since.  No vomiting or diarrhea.  No OTC medications taken today; she took Coricidin and Flonase nasal spray last night.  Her boyfriend currently has walking pneumonia.  Patient also requests a pregnancy test.  She has not had a menstrual cycle in several months but has the Nexplanon implant.  The history is provided by the patient and medical records.    History reviewed. No pertinent past medical history.  There are no active problems to display for this patient.   Past Surgical History:  Procedure Laterality Date   NO PAST SURGERIES      OB History     Gravida  0   Para  0   Term  0   Preterm  0   AB  0   Living  0      SAB  0   IAB  0   Ectopic  0   Multiple  0   Live Births  0            Home Medications    Prior to Admission medications   Medication Sig Start Date End Date Taking? Authorizing Provider  azithromycin (ZITHROMAX) 250 MG tablet Take 1 tablet (250 mg total) by mouth daily. Take first 2 tablets together, then 1 every day until finished. 06/25/23  Yes Mickie Bail, NP  cyclobenzaprine (FLEXERIL) 5 MG tablet Take 1 tablet (5 mg total) by mouth 3 (three) times daily as needed. 04/26/23   Romeo Apple, Myah A, PA-C  etodolac (LODINE) 400 MG tablet Take 1 tablet (400 mg total) by mouth 2 (two) times daily. With food Patient not taking: Reported on 08/06/2022 08/28/21   Tommi Rumps, PA-C  etonogestrel (NEXPLANON) 68 MG IMPL implant 1 each by Subdermal route once.    [provider]  ibuprofen (ADVIL) 800 MG tablet Take 1 tablet (800 mg total) by mouth every 8 (eight) hours as needed. 04/26/23    Kern Reap A, PA-C    Family History Family History  Problem Relation Age of Onset   Hypertension Mother    Healthy Father     Social History Social History   Tobacco Use   Smoking status: Never   Smokeless tobacco: Never  Vaping Use   Vaping status: Some Days   Substances: Nicotine, Flavoring  Substance Use Topics   Alcohol use: Yes    Comment: Socially   Drug use: Not Currently    Types: Marijuana     Allergies   Patient has no known allergies.   Review of Systems Review of Systems  Constitutional:  Negative for chills and fever.  HENT:  Positive for congestion, rhinorrhea and sore throat. Negative for ear pain.   Respiratory:  Positive for cough and shortness of breath.   Gastrointestinal:  Negative for diarrhea and vomiting.  Musculoskeletal:  Positive for back pain.     Physical Exam Triage Vital Signs ED Triage Vitals  Encounter Vitals Group     BP 06/25/23 0949 120/78     Systolic BP Percentile --      Diastolic BP Percentile --  Pulse Rate 06/25/23 0949 65     Resp 06/25/23 0949 18     Temp 06/25/23 0949 98.7 F (37.1 C)     Temp src --      SpO2 06/25/23 0949 98 %     Weight --      Height --      Head Circumference --      Peak Flow --      Pain Score 06/25/23 0958 7     Pain Loc --      Pain Education --      Exclude from Growth Chart --    No data found.  Updated Vital Signs BP 120/78   Pulse 65   Temp 98.7 F (37.1 C)   Resp 18   SpO2 98%   Visual Acuity Right Eye Distance:   Left Eye Distance:   Bilateral Distance:    Right Eye Near:   Left Eye Near:    Bilateral Near:     Physical Exam Constitutional:      General: She is not in acute distress. HENT:     Right Ear: Tympanic membrane normal.     Left Ear: Tympanic membrane normal.     Nose: Rhinorrhea present.     Mouth/Throat:     Mouth: Mucous membranes are moist.     Pharynx: Oropharynx is clear.  Cardiovascular:     Rate and Rhythm: Normal rate and  regular rhythm.     Heart sounds: Normal heart sounds.  Pulmonary:     Effort: Pulmonary effort is normal. No respiratory distress.     Breath sounds: Normal breath sounds.  Neurological:     Mental Status: She is alert.      UC Treatments / Results  Labs (all labs ordered are listed, but only abnormal results are displayed) Labs Reviewed  POCT URINE PREGNANCY    EKG   Radiology No results found.  Procedures Procedures (including critical care time)  Medications Ordered in UC Medications - No data to display  Initial Impression / Assessment and Plan / UC Course  I have reviewed the triage vital signs and the nursing notes.  Pertinent labs & imaging results that were available during my care of the patient were reviewed by me and considered in my medical decision making (see chart for details).    Acute upper respiratory infection, negative pregnancy test.  Lungs are clear and O2 sat is 98% on room air.  Patient has been symptomatic for 5 days and is not improving with OTC treatment.  Her boyfriend has walking pneumonia.  Treating patient today with Zithromax.  Tylenol or ibuprofen as needed.  Instructed her to follow-up with her PCP if she is not improving.  She agrees to plan of care.  Final Clinical Impressions(s) / UC Diagnoses   Final diagnoses:  Acute upper respiratory infection  Negative pregnancy test     Discharge Instructions      The pregnancy test is negative.    Take th Zithromax as directed.  Follow-up with your primary care provider if your symptoms are not improving.      ED Prescriptions     Medication Sig Dispense Auth. Provider   azithromycin (ZITHROMAX) 250 MG tablet Take 1 tablet (250 mg total) by mouth daily. Take first 2 tablets together, then 1 every day until finished. 6 tablet Mickie Bail, NP      PDMP not reviewed this encounter.   Mickie Bail, NP 06/25/23 [phone]

## 2023-06-25 NOTE — ED Triage Notes (Signed)
Patient to Urgent Care with complaints of nasal congestion/ cough/ sore throat. Reports some shortness of breath/ tightness in her chest this morning/ back pain between her shoulder blades. Fever on Sunday.   Reports symptoms started Saturday night.  Taking Mucinex/ flonase/ coricidin extra strength. Reports boyfriend with walking pneumonia.

## 2023-06-25 NOTE — Discharge Instructions (Addendum)
The pregnancy test is negative.    Take th Zithromax as directed.  Follow-up with your primary care provider if your symptoms are not improving.

## 2024-01-08 ENCOUNTER — Ambulatory Visit

## 2024-01-09 ENCOUNTER — Ambulatory Visit

## 2024-01-20 ENCOUNTER — Ambulatory Visit (LOCAL_COMMUNITY_HEALTH_CENTER): Admitting: Family Medicine

## 2024-01-20 ENCOUNTER — Encounter: Payer: Self-pay | Admitting: Family Medicine

## 2024-01-20 VITALS — BP 116/77 | HR 63 | Ht 62.0 in | Wt 139.6 lb

## 2024-01-20 DIAGNOSIS — Z01419 Encounter for gynecological examination (general) (routine) without abnormal findings: Secondary | ICD-10-CM | POA: Diagnosis not present

## 2024-01-20 DIAGNOSIS — R200 Anesthesia of skin: Secondary | ICD-10-CM | POA: Insufficient documentation

## 2024-01-20 DIAGNOSIS — Z3009 Encounter for other general counseling and advice on contraception: Secondary | ICD-10-CM

## 2024-01-20 NOTE — Progress Notes (Signed)
 Pt is here for PE. Decline testing and condoms. Opportunity given to patient to ask questions for any clarifications, questions answered. Wilkie Drought, RN.

## 2024-01-20 NOTE — Assessment & Plan Note (Signed)
 Acute, intermittent. Overall 4 weeks of intermittent symptoms. Reports numbness and tingling that affects left leg from knee to calf. Is most concerned about DVT. Discussed differential, which is broad and includes DVT, nerve impingement, bony abnormality of the knee, Baker's cyst, B12 and folate deficiency, and diabetes. DVT less likely given absence of swelling, erythema, warmth, intermittent nature of symptoms, and lack of risk factors. Nerve impingement and bony abnormality of knee, but physical exam reassuring. Baker's cyst not palpated on physical exam. Diabetes or B12 and folate deficiency less likely given unilateral nature.   Discussed low likelihood of DVT. Recommended PCP visit for further work up. ED precautions given.

## 2024-01-20 NOTE — Progress Notes (Signed)
 [employer] [facility] [address] [place] [address] Main phone: [phone]  Family Planning Visit - Initial Visit  Subjective:  Jill Paul is a 28 y.o.  [id number]   being seen today for an initial annual visit and to discuss reproductive life planning.  The patient is currently using hormonal implant for pregnancy prevention. Patient does not want a pregnancy in the next year.   Patient reports they are looking for a method with the following characteristics:  High efficacy at preventing pregnancy Long term method  Patient has the following medical conditions: Patient Active Problem List   Diagnosis Date Noted   Numbness and tingling of left leg 01/20/2024   Well woman exam 01/20/2024   Chief Complaint  Patient presents with   Annual Exam    Pt is here for PE   HPI Patient reports she would like well woman exam and evaluation of her left leg concern. PAP UTD, next due 08/05/2025. No STI screening desired. Too young for mammogram screenings. Contraception is Nexplanon, placed May 2024 at [facility].   Does have periods on Nexplanon. LMP 7/26, normal period for her. Started to bleed again 8/02, intermittent spotting for several days.   Started to take chlorophyll supplement early August and then became concerned when numbness and tingling in left leg started 8/05. She noticed a little left ankle swelling and had SOB immediately before presenting to urgent care 8/21. Given her complaints, they directed her to the ED but she did not go due to cost and time restraints. Her SOB spontaneously resolved.   Patient denies leg redness or pain, leg swelling, and chronic SOB.   Patient denies known personal or family history of hypercoagulability or previous DVT. Does not smoke. No recent long travel, surgeries, or medication changes. Does not take exogenous estrogens.   Review of Systems  Constitutional:  Negative for  fever, malaise/fatigue and weight loss.  Respiratory:  Negative for shortness of breath.   Cardiovascular:  Negative for chest pain and palpitations.  (+) intermittent leg numbness and tingling  Diabetes screening This patient is 28 y.o. with a BMI of Body mass index is 25.53 kg/m.SABRA  Is patient eligible for diabetes screening (age >35 and BMI >25)?  no  Was Hgb A1c ordered? not applicable  STI screening Patient reports 1 of partners in last year.  Does this patient desire STI screening?  No - declines  Hepatitis C screening Has patient been screened once for HCV in the past?  No  No results found for: HCVAB  Does the patient meet criteria for HCV testing? No   Hepatitis B screening Does the patient meet criteria for HBV testing? No  Cervical Cancer Screening  Result Date Procedure Results Follow-ups  08/06/2022 IGP, rfx Aptima HPV ASCU DIAGNOSIS:: Comment Specimen adequacy:: Comment Clinician Provided ICD10: Comment Performed by:: Comment PAP Smear Comment: . Note:: Comment Test Methodology: Comment PAP Reflex: Comment    Health Maintenance Due  Topic Date Due   HPV VACCINES (1 - 3-dose series) Never done   Hepatitis C Screening  Never done   DTaP/Tdap/Td (1 - Tdap) Never done   COVID-19 Vaccine (1 - 2024-25 season) Never done   INFLUENZA VACCINE  12/26/2023   The following portions of the patient's history were reviewed and updated as appropriate: allergies, current medications, past family history, past medical history, past social history, past surgical history and problem list. Problem list updated.  See flowsheet for further  details and programmatic requirements Hyperlink available at the top of the signed note in blue.  Flow sheet content below:  Pregnancy Intention Screening Does the patient want to become pregnant in the next year?: Yes Does the patient's partner want to become pregnant in the next year?: Yes Would the patient like to discuss contraceptive  options today?: No Sexual History What age did you start your period?: 12 How often do you have your period?: irregular Date of last sex?: 01/16/24 Has the patient had unprotected sex within the last 5 days?: No Do you have sex with men, women, both men and women?: Men only In the past 2 months how many partners have you had sex with?: 1 In the past 12 months, how many partners have you had sex with?: 1 Is it possible that any of your sex partners in the past 12 months had sex with someone else whild they were still in a sexual relationship with you?: No What ways do you have sex?: Vaginal Do you or your partner use condoms and/or dental dams every time you have vaginal, oral or anal sex?: No Do you douche?: No Date of last HIV test?: 08/06/22 Have you ever had an STD?: No Have any of your partners had an STD?: No Have you or your partner ever shot up drugs?: No Have any of your partners used drugs in the past?: No Have you or your partners exchanged money or drugs for sex?: No  Objective:   Vitals:   01/20/24 0929  BP: 116/77  Pulse: 63  Weight: 139 lb 9.6 oz (63.3 kg)  Height: 5' 2 (1.575 m)   Physical Exam Vitals and nursing note reviewed.  Constitutional:      Appearance: Normal appearance.  HENT:     Head: Normocephalic and atraumatic.     Mouth/Throat:     Mouth: Mucous membranes are moist.     Pharynx: Oropharynx is clear. No oropharyngeal exudate or posterior oropharyngeal erythema.  Eyes:     General: No scleral icterus.       Right eye: No discharge.        Left eye: No discharge.     Conjunctiva/sclera: Conjunctivae normal.  Neck:     Thyroid: No thyroid mass or thyromegaly.  Cardiovascular:     Rate and Rhythm: Normal rate and regular rhythm.     Heart sounds: Normal heart sounds.  Pulmonary:     Effort: Pulmonary effort is normal.     Breath sounds: Normal breath sounds.  Abdominal:     General: Abdomen is flat. There is no distension.      Palpations: Abdomen is soft.     Tenderness: There is no abdominal tenderness. There is no guarding or rebound.  Genitourinary:    Comments: Declined genital exam, no symptoms, no STI screening desired, no PAP due Musculoskeletal:        General: Normal range of motion.     Cervical back: Neck supple. No rigidity or tenderness.     Right knee: Normal.     Left knee: No swelling, deformity, erythema or bony tenderness. Normal range of motion. No tenderness. No ACL laxity or PCL laxity.Normal alignment and normal meniscus.     Instability Tests: Anterior drawer test negative. Posterior drawer test negative.     Right lower leg: Tenderness (mild veralized discomfort on calf squeeze) present. No swelling, deformity, lacerations or bony tenderness. No edema.     Left lower leg: Normal. No swelling, deformity, lacerations,  tenderness (No pain on calf squeeze) or bony tenderness. No edema.     Right ankle: Normal. No swelling, deformity or ecchymosis. No tenderness. Normal range of motion.     Right Achilles Tendon: Normal. No tenderness.     Left ankle: Normal. No swelling, deformity or ecchymosis. No tenderness.     Left Achilles Tendon: Normal. No tenderness.  Lymphadenopathy:     Head:     Right side of head: No submandibular, preauricular or posterior auricular adenopathy.     Left side of head: No submandibular, preauricular or posterior auricular adenopathy.     Cervical: No cervical adenopathy.     Right cervical: No superficial or posterior cervical adenopathy.    Left cervical: No superficial or posterior cervical adenopathy.     Upper Body:     Right upper body: No supraclavicular adenopathy.     Left upper body: No supraclavicular adenopathy.  Skin:    General: Skin is warm and dry.     Coloration: Skin is not jaundiced or pale.     Findings: No bruising, erythema, lesion or rash.  Neurological:     General: No focal deficit present.     Mental Status: She is alert and oriented  to person, place, and time.  Psychiatric:        Mood and Affect: Mood normal.        Behavior: Behavior normal.    Assessment and Plan:  Jill Paul is a 28 y.o. female presenting to the [facility] Department for an initial annual wellness/contraceptive visit  Contraception counseling:  Reviewed options based on patient desire and reproductive life plan. Patient is interested in Hormonal Implant.   Risks, benefits, and typical effectiveness rates were reviewed.  Questions were answered.  Written information was also given to the patient to review.    The patient will follow up in  1 years for surveillance.  The patient was told to call with any further questions, or with any concerns about this method of contraception.  Emphasized use of condoms 100% of the time for STI prevention.  Emergency Contraception Precautions (ECP): Patient assessed for need of ECP. She is not a candidate based on LARC in place and unexpired .   Well woman exam Assessment & Plan: Unremarkable physical exam. PAP UTD, next due 08/05/2025. No STI screening or contraception needs at this time. Return in 1 year for next annual physical.    Numbness and tingling of left leg Assessment & Plan: Acute, intermittent. Overall 4 weeks of intermittent symptoms. Reports numbness and tingling that affects left leg from knee to calf. Is most concerned about DVT. Discussed differential, which is broad and includes DVT, nerve impingement, bony abnormality of the knee, Baker's cyst, B12 and folate deficiency, and diabetes. DVT less likely given absence of swelling, erythema, warmth, intermittent nature of symptoms, and lack of risk factors. Nerve impingement and bony abnormality of knee, but physical exam reassuring. Baker's cyst not palpated on physical exam. Diabetes or B12 and folate deficiency less likely given unilateral nature.   Discussed low likelihood of DVT. Recommended PCP visit for further work up. ED  precautions given.      No follow-ups on file.  No future appointments.  Betsey CHRISTELLA Helling, MD

## 2024-01-20 NOTE — Assessment & Plan Note (Addendum)
 Unremarkable physical exam. PAP UTD, next due 08/05/2025. No STI screening or contraception needs at this time. Return in 1 year for next annual physical.

## 2024-02-19 ENCOUNTER — Ambulatory Visit: Payer: Self-pay
